# Patient Record
Sex: Female | Born: 2000 | Race: White | Hispanic: No | Marital: Single | Smoking: Never smoker
Health system: Southern US, Community
[De-identification: ages and names within clinical notes are randomized; demographics above are authoritative.]

---
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
## 2019-11-14 IMAGING — MR MR WRIST*L* W/O CM
7 series · 40 of 40 positions shown · non-contrast
Comparison: None.

CLINICAL DATA: Fell 3 days ago.  Persistent pain and swelling.

EXAM:
MR OF THE LEFT WRIST WITHOUT CONTRAST
TECHNIQUE: Multiplanar, multisequence MR imaging of the left wrist was
performed. No intravenous contrast was administered.

[Series 3: T2 fat-sat · axial · left · 3.0mm · 0.28mm/px · z∈[-77,+19]mm · 6 of 31 slices shown (1 of 3)]
[im 1/31]
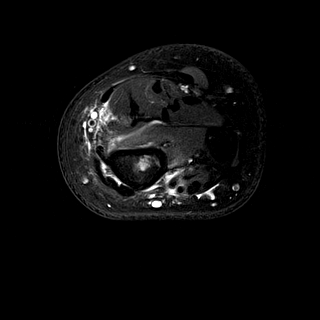
[im 7/31]
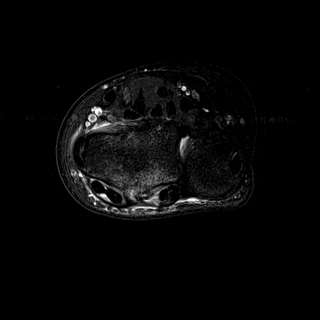
[im 13/31]
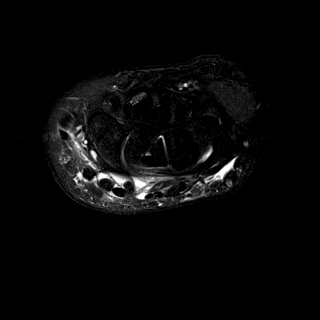
[im 19/31]
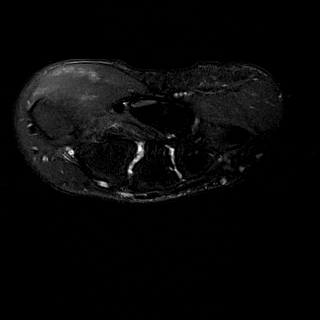
[im 25/31]
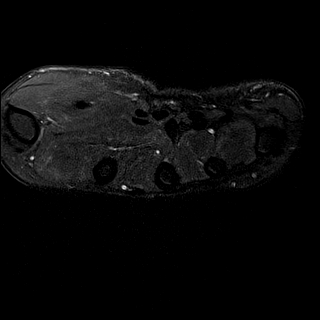
[im 31/31]
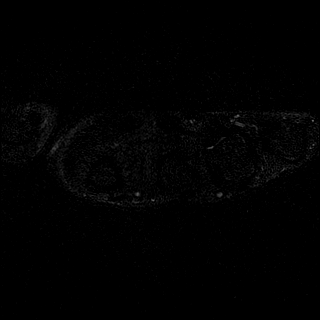

[Series 4: T1 · coronal · left · 3.0mm · 0.23mm/px · 5 of 20 slices shown]
[im 1/20]
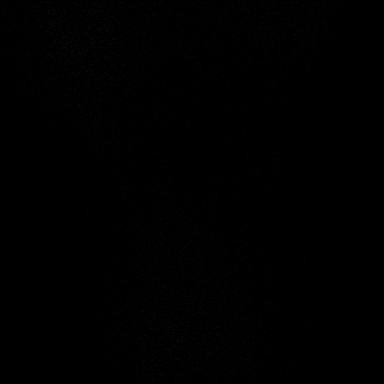
[im 5/20]
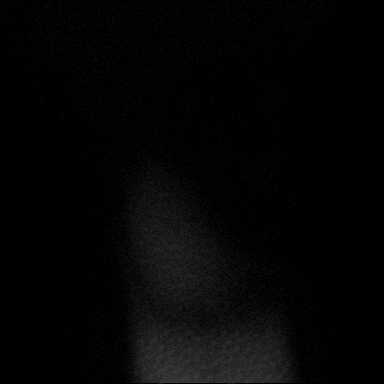
[im 10/20]
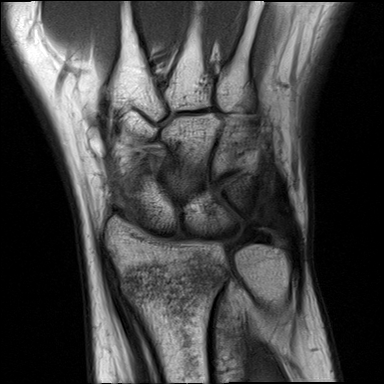
[im 15/20]
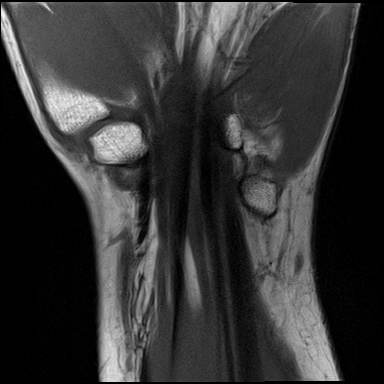
[im 20/20]
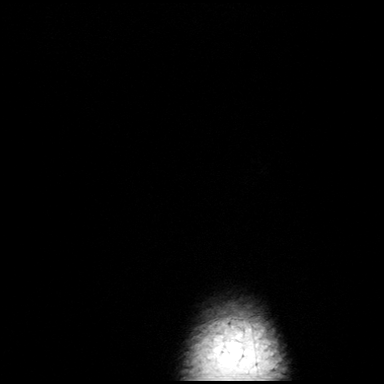

[Series 5: T2 fat-sat · coronal · left · 3.0mm · 0.28mm/px · 5 of 20 slices shown (2 of 3)]
[im 1/20]
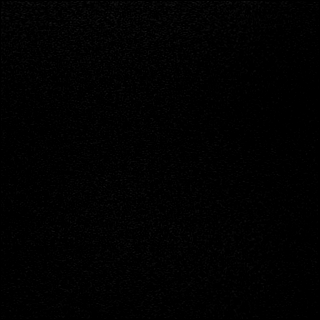
[im 5/20]
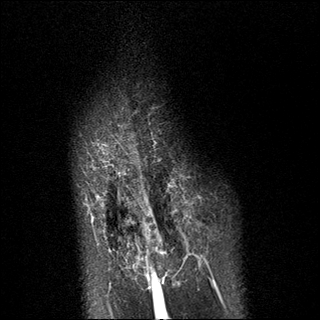
[im 10/20]
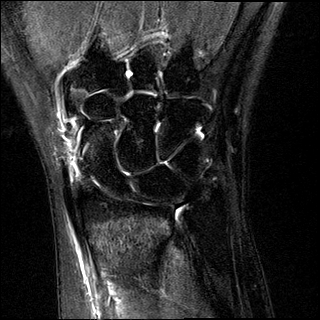
[im 15/20]
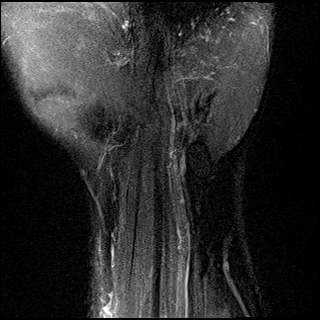
[im 20/20]
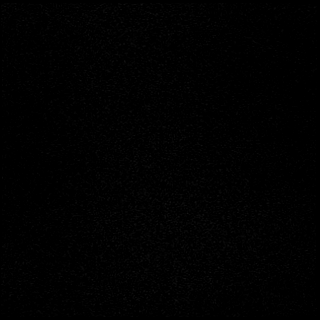

[Series 6: PD fat-sat · coronal · left · 3.0mm · 0.28mm/px · 5 of 20 slices shown (1 of 2)]
[im 1/20]
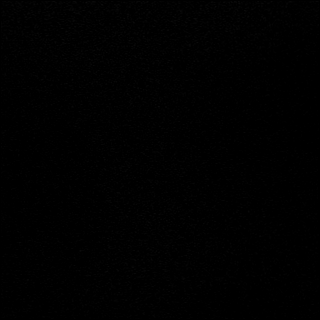
[im 5/20]
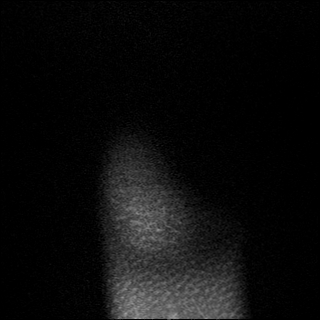
[im 10/20]
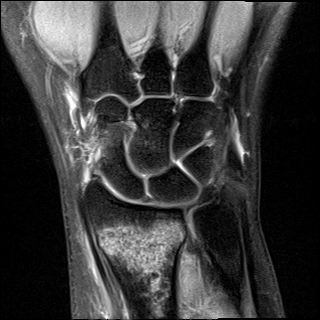
[im 15/20]
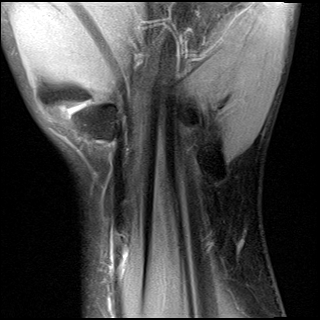
[im 20/20]
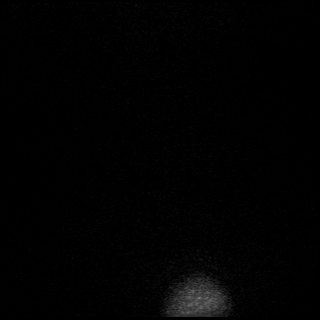

[Series 7: PD fat-sat · sagittal · left · 3.0mm · 0.28mm/px · 7 of 28 slices shown (2 of 2)]
[im 1/28]
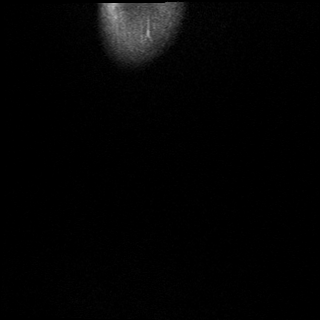
[im 5/28]
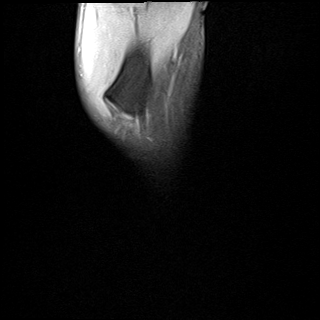
[im 10/28]
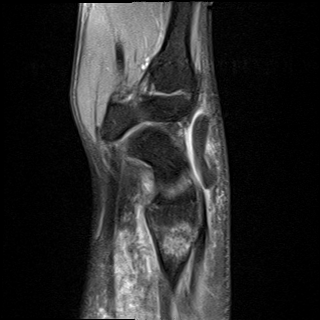
[im 14/28]
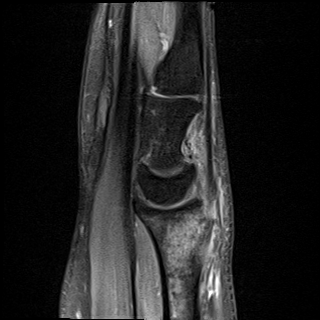
[im 19/28]
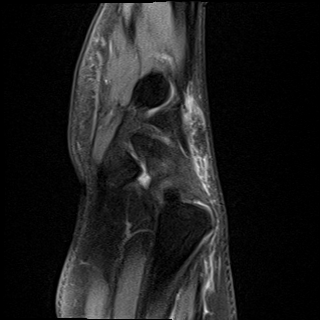
[im 23/28]
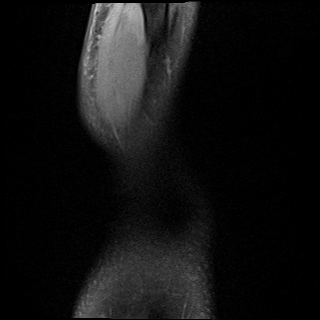
[im 28/28]
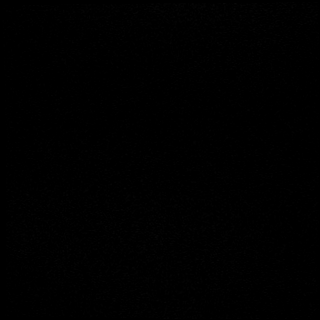

[Series 8: STIR · coronal · left · 3.0mm · 0.31mm/px · 5 of 20 slices shown]
[im 1/20]
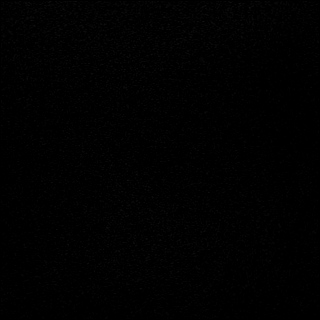
[im 5/20]
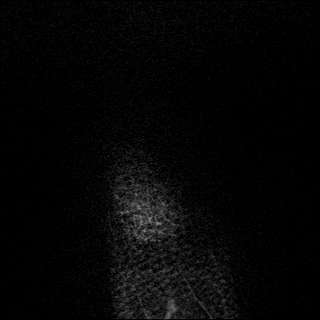
[im 10/20]
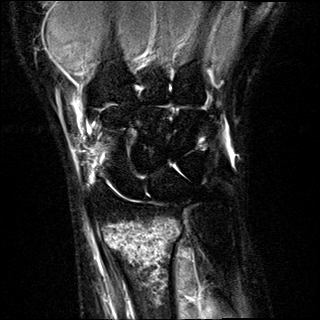
[im 15/20]
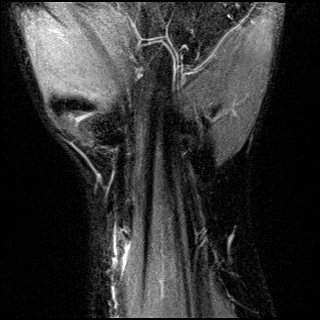
[im 20/20]
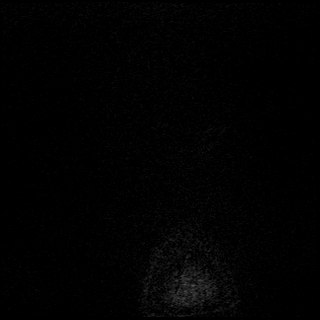

[Series 9: T2 fat-sat · axial · left · 3.0mm · 0.28mm/px · z∈[-77,+19]mm · 7 of 31 slices shown (3 of 3)]
[im 1/31]
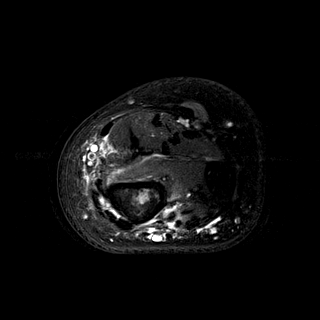
[im 6/31]
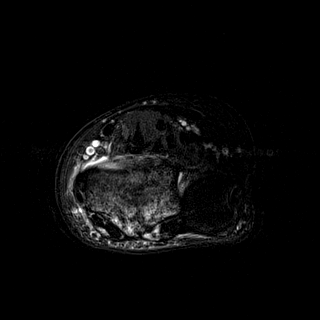
[im 11/31]
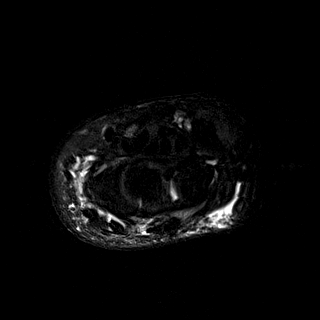
[im 16/31]
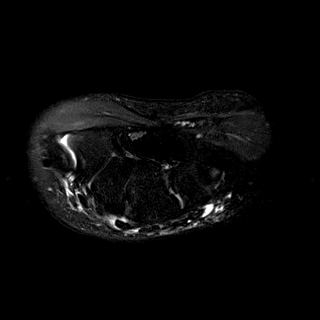
[im 21/31]
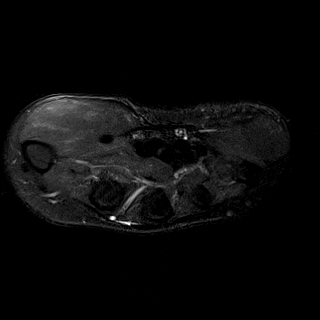
[im 26/31]
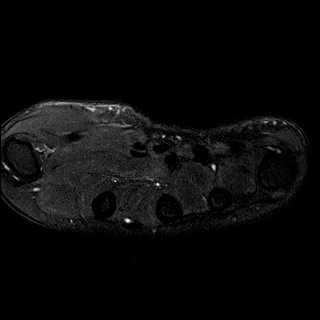
[im 31/31]
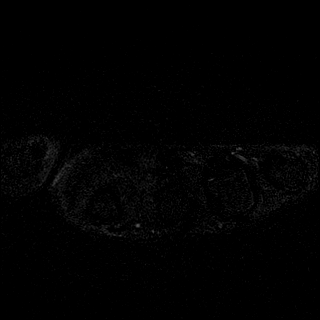

[40 of 40 positions shown; findings below may reference images not displayed]

FINDINGS: Ligaments: The intercarpal ligaments appear intact. The intercarpal
joint spaces are maintained.

Triangular fibrocartilage: No obvious tear. No significant fluid in
the radioulnar joint.

Tendons: Suspect traumatic tenosynovitis involving the second and
third dorsal compartments.

Carpal tunnel/median nerve: Unremarkable.

Guyon's canal: Normal

Joint/cartilage: Small amount of joint fluid but no overt effusion.
No cartilage defects or osteochondral lesions.

Bones/carpal alignment: There is a nondisplaced fracture of the
distal radius involving the metaphysis. There is buckling of the
dorsal cortex best seen on the sagittal sequence.

The carpal and visualized metacarpal bones are intact. No ulnar
fractures identified.

Other: Muscle injuries with muscle contusions or strains involving
the thenar muscles and also the radial aspect of the pronator
quadratus muscle.

There is a curvilinear structure near the second dorsal compartment
which demonstrates increased T1 and low T2 signal intensity. This
could be a small hematoma or possibly a lipoma but is not likely
clinically significant.
IMPRESSION: 1. Nondisplaced metaphyseal fracture of the radius with slight
dorsal cortical buckling and surrounding marrow edema.
2. Probable muscle strains or contusions involving the thenar
musculature and the pronator quadratus.
3. Intact intercarpal ligaments and TFCC.
